# Patient Record
Sex: Female | Born: 1960 | Race: Black or African American | Hispanic: No | Smoking: Never smoker
Health system: Southern US, Community
[De-identification: ages and names within clinical notes are randomized; demographics above are authoritative.]

## PROBLEM LIST (undated history)

## (undated) DIAGNOSIS — Z87898 Personal history of other specified conditions: Secondary | ICD-10-CM

## (undated) DIAGNOSIS — N12 Tubulo-interstitial nephritis, not specified as acute or chronic: Secondary | ICD-10-CM

## (undated) DIAGNOSIS — IMO0002 Reserved for concepts with insufficient information to code with codable children: Secondary | ICD-10-CM

## (undated) DIAGNOSIS — I10 Essential (primary) hypertension: Secondary | ICD-10-CM

## (undated) DIAGNOSIS — N736 Female pelvic peritoneal adhesions (postinfective): Secondary | ICD-10-CM

## (undated) DIAGNOSIS — Z86018 Personal history of other benign neoplasm: Secondary | ICD-10-CM

## (undated) DIAGNOSIS — Z8742 Personal history of other diseases of the female genital tract: Secondary | ICD-10-CM

## (undated) DIAGNOSIS — A599 Trichomoniasis, unspecified: Secondary | ICD-10-CM

## (undated) DIAGNOSIS — N951 Menopausal and female climacteric states: Secondary | ICD-10-CM

## (undated) DIAGNOSIS — N83201 Unspecified ovarian cyst, right side: Secondary | ICD-10-CM

## (undated) HISTORY — DX: Female pelvic peritoneal adhesions (postinfective): N73.6

## (undated) HISTORY — DX: Menopausal and female climacteric states: N95.1

## (undated) HISTORY — DX: Reserved for concepts with insufficient information to code with codable children: IMO0002

## (undated) HISTORY — DX: Trichomoniasis, unspecified: A59.9

## (undated) HISTORY — DX: Personal history of other specified conditions: Z87.898

## (undated) HISTORY — DX: Personal history of other diseases of the female genital tract: Z87.42

## (undated) HISTORY — DX: Tubulo-interstitial nephritis, not specified as acute or chronic: N12

## (undated) HISTORY — DX: Unspecified ovarian cyst, right side: N83.201

## (undated) HISTORY — DX: Personal history of other benign neoplasm: Z86.018

---

## 1988-09-17 DIAGNOSIS — Z86018 Personal history of other benign neoplasm: Secondary | ICD-10-CM

## 1988-09-17 HISTORY — DX: Personal history of other benign neoplasm: Z86.018

## 1991-09-18 DIAGNOSIS — Z8742 Personal history of other diseases of the female genital tract: Secondary | ICD-10-CM

## 1991-09-18 HISTORY — DX: Personal history of other diseases of the female genital tract: Z87.42

## 1992-08-17 HISTORY — PX: ANKLE SURGERY: SHX546

## 1992-10-18 DIAGNOSIS — N12 Tubulo-interstitial nephritis, not specified as acute or chronic: Secondary | ICD-10-CM

## 1992-10-18 HISTORY — DX: Tubulo-interstitial nephritis, not specified as acute or chronic: N12

## 1992-11-09 DIAGNOSIS — IMO0002 Reserved for concepts with insufficient information to code with codable children: Secondary | ICD-10-CM

## 1992-11-09 HISTORY — DX: Reserved for concepts with insufficient information to code with codable children: IMO0002

## 1993-01-15 DIAGNOSIS — N736 Female pelvic peritoneal adhesions (postinfective): Secondary | ICD-10-CM

## 1993-01-15 HISTORY — DX: Female pelvic peritoneal adhesions (postinfective): N73.6

## 1993-01-30 HISTORY — PX: MYOMECTOMY ABDOMINAL APPROACH: SUR870

## 1993-06-19 DIAGNOSIS — A599 Trichomoniasis, unspecified: Secondary | ICD-10-CM

## 1993-06-19 HISTORY — DX: Trichomoniasis, unspecified: A59.9

## 1998-02-18 ENCOUNTER — Emergency Department (HOSPITAL_COMMUNITY): Admission: EM | Admit: 1998-02-18 | Discharge: 1998-02-18 | Payer: Self-pay | Admitting: Emergency Medicine

## 1998-08-31 ENCOUNTER — Emergency Department (HOSPITAL_COMMUNITY): Admission: EM | Admit: 1998-08-31 | Discharge: 1998-08-31 | Payer: Self-pay | Admitting: Emergency Medicine

## 2001-02-13 DIAGNOSIS — N951 Menopausal and female climacteric states: Secondary | ICD-10-CM

## 2001-02-13 HISTORY — DX: Menopausal and female climacteric states: N95.1

## 2003-11-18 ENCOUNTER — Other Ambulatory Visit: Admission: RE | Admit: 2003-11-18 | Discharge: 2003-11-18 | Payer: Self-pay | Admitting: Obstetrics and Gynecology

## 2005-10-02 ENCOUNTER — Other Ambulatory Visit: Admission: RE | Admit: 2005-10-02 | Discharge: 2005-10-02 | Payer: Self-pay | Admitting: Obstetrics and Gynecology

## 2006-06-21 DIAGNOSIS — Z87898 Personal history of other specified conditions: Secondary | ICD-10-CM

## 2006-06-21 DIAGNOSIS — Z8742 Personal history of other diseases of the female genital tract: Secondary | ICD-10-CM

## 2006-06-21 HISTORY — DX: Personal history of other specified conditions: Z87.898

## 2006-06-21 HISTORY — DX: Personal history of other diseases of the female genital tract: Z87.42

## 2006-08-16 DIAGNOSIS — N83201 Unspecified ovarian cyst, right side: Secondary | ICD-10-CM

## 2006-08-16 HISTORY — DX: Unspecified ovarian cyst, right side: N83.201

## 2007-01-10 ENCOUNTER — Encounter: Admission: RE | Admit: 2007-01-10 | Discharge: 2007-01-10 | Payer: Self-pay | Admitting: Obstetrics and Gynecology

## 2010-10-08 ENCOUNTER — Encounter: Payer: Self-pay | Admitting: Obstetrics and Gynecology

## 2010-10-08 ENCOUNTER — Encounter: Payer: Self-pay | Admitting: Cardiology

## 2011-12-04 ENCOUNTER — Ambulatory Visit: Payer: Self-pay | Admitting: Obstetrics and Gynecology

## 2011-12-28 ENCOUNTER — Ambulatory Visit: Payer: Self-pay | Admitting: Obstetrics and Gynecology

## 2012-01-15 ENCOUNTER — Ambulatory Visit (INDEPENDENT_AMBULATORY_CARE_PROVIDER_SITE_OTHER): Payer: Private Health Insurance - Indemnity | Admitting: Obstetrics and Gynecology

## 2012-01-15 ENCOUNTER — Encounter: Payer: Self-pay | Admitting: Obstetrics and Gynecology

## 2012-01-15 VITALS — BP 122/88 | Resp 14 | Ht 65.0 in | Wt 189.0 lb

## 2012-01-15 DIAGNOSIS — Z30431 Encounter for routine checking of intrauterine contraceptive device: Secondary | ICD-10-CM

## 2012-01-15 DIAGNOSIS — Z975 Presence of (intrauterine) contraceptive device: Secondary | ICD-10-CM

## 2012-01-15 DIAGNOSIS — Z124 Encounter for screening for malignant neoplasm of cervix: Secondary | ICD-10-CM

## 2012-01-15 DIAGNOSIS — Z01419 Encounter for gynecological examination (general) (routine) without abnormal findings: Secondary | ICD-10-CM
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
## 2012-01-15 NOTE — Progress Notes (Signed)
Addended by: Marla Roe A on: 01/15/2012 05:01 PM   Modules accepted: Orders

## 2012-01-16 ENCOUNTER — Other Ambulatory Visit: Payer: Self-pay | Admitting: Obstetrics and Gynecology

## 2012-01-16 ENCOUNTER — Telehealth: Payer: Self-pay | Admitting: Obstetrics and Gynecology

## 2012-01-16 ENCOUNTER — Other Ambulatory Visit: Payer: Self-pay

## 2012-01-16 DIAGNOSIS — Z1231 Encounter for screening mammogram for malignant neoplasm of breast: Secondary | ICD-10-CM

## 2012-01-17 LAB — PAP IG, CT-NG, RFX HPV ASCU: Chlamydia Probe Amp: NEGATIVE

## 2012-02-01 ENCOUNTER — Encounter: Payer: Self-pay | Admitting: Obstetrics and Gynecology

## 2012-02-01 ENCOUNTER — Ambulatory Visit (INDEPENDENT_AMBULATORY_CARE_PROVIDER_SITE_OTHER): Payer: Private Health Insurance - Indemnity | Admitting: Obstetrics and Gynecology

## 2012-02-01 ENCOUNTER — Ambulatory Visit: Payer: Self-pay

## 2012-02-01 VITALS — BP 120/72 | Ht 65.0 in | Wt 188.0 lb

## 2012-02-01 DIAGNOSIS — Z975 Presence of (intrauterine) contraceptive device: Secondary | ICD-10-CM

## 2012-02-01 DIAGNOSIS — Z30430 Encounter for insertion of intrauterine contraceptive device: Secondary | ICD-10-CM

## 2012-02-01 MED ORDER — LEVONORGESTREL 20 MCG/24HR IU IUD
INTRAUTERINE_SYSTEM | Freq: Once | INTRAUTERINE | Status: AC
  Administered 2012-02-01: 15:00:00 via INTRAUTERINE

## 2012-02-01 NOTE — Progress Notes (Signed)
No complaints Here for IUD removal and reinsertion  Filed Vitals:   02/01/12 1403  BP: 120/72   IUD INSERTION NOTE   Consent signed after risks and benefits were reviewed including but not limited to bleeding, infection and risk of uterine perforation. LMP: No LMP recorded. Patient is not currently having periods (Reason: IUD). UPT: Negative GC / Chlamydia: negative 01/15/12  MIRENA SERIAL NUMBER: see MAR  Prepping with Betadine Tenaculum placed on anterior lip of cervix Uterus sounded at  8.5 cm Insertion of MIRENA IUD per protocol without any complications  POST-PROCEDURE:  Patient instructed to call with fever or excessive pain Patient instructed to check IUD strings after each menstrual cycle   Follow-up: 5 weeks for IUD check F/U for colpo secondary to abnl pap   Purcell Nails MD 02/01/2012 2:30 PM

## 2012-02-01 NOTE — Progress Notes (Deleted)
Patient ID: Gwendolyn Luna, female   DOB: 1961-09-02, 52 y.o.   MRN: [id number] IUD INSERTION NOTE   Consent signed after risks and benefits were reviewed including but not limited to bleeding, infection and risk of uterine perforation.  LMP: No LMP recorded. Patient is not currently having periods (Reason: IUD). UPT: {pos/neg:18640} GC / Chlamydia: {pos/neg/not done:321853}  MIRENA SERIAL NUMBER: ***  Prepping with Betadine Tenaculum placed on anterior lip of cervix Uterus sounded at  *** cm Insertion of MIRENA IUD per protocol without any complications  POST-PROCEDURE:  Patient instructed to call with fever or excessive pain Patient instructed to check IUD strings after each menstrual cycle   Follow-up: *** weeks   Levin Erp MD 02/01/2012 2:34 PM

## 2012-02-04 ENCOUNTER — Telehealth: Payer: Self-pay

## 2012-02-04 NOTE — Telephone Encounter (Signed)
Spoke to pt . Notified of pap results and scheduled for colpo on 03/19/2012. Pt then called later and I re-explained procedure and answered ?'s., per AF Melody Comas A

## 2012-02-04 NOTE — Telephone Encounter (Signed)
Spoke to pt and scheduled colpo for 03/19/2012 w/ AR. IUD check can be the same day. Melody Comas A

## 2012-02-08 ENCOUNTER — Telehealth: Payer: Self-pay | Admitting: Obstetrics and Gynecology

## 2012-02-08 NOTE — Telephone Encounter (Signed)
Jackie/epic °

## 2012-02-15 ENCOUNTER — Ambulatory Visit
Admission: RE | Admit: 2012-02-15 | Discharge: 2012-02-15 | Disposition: A | Discharge status: Home / Self Care | Payer: Private Health Insurance - Indemnity | Source: Ambulatory Visit | Attending: Obstetrics and Gynecology | Admitting: Obstetrics and Gynecology

## 2012-02-15 DIAGNOSIS — Z1231 Encounter for screening mammogram for malignant neoplasm of breast: Secondary | ICD-10-CM

## 2012-02-20 ENCOUNTER — Telehealth: Payer: Self-pay

## 2012-02-20 NOTE — Telephone Encounter (Signed)
LM for pt to cb to get her r/s for the colpo she was sched for on 03/19/2012 when Dr. Su Hilt is not in the office. Melody Comas A

## 2012-02-26 ENCOUNTER — Telehealth: Payer: Self-pay | Admitting: Obstetrics and Gynecology

## 2012-02-26 NOTE — Telephone Encounter (Signed)
Gwendolyn Luna/return call

## 2012-02-28 ENCOUNTER — Telehealth: Payer: Self-pay

## 2012-03-03 ENCOUNTER — Emergency Department (HOSPITAL_BASED_OUTPATIENT_CLINIC_OR_DEPARTMENT_OTHER): Payer: Private Health Insurance - Indemnity

## 2012-03-03 ENCOUNTER — Emergency Department (HOSPITAL_BASED_OUTPATIENT_CLINIC_OR_DEPARTMENT_OTHER)
Admission: EM | Admit: 2012-03-03 | Discharge: 2012-03-03 | Disposition: A | Discharge status: Home / Self Care | Payer: Private Health Insurance - Indemnity | Attending: Emergency Medicine | Admitting: Emergency Medicine

## 2012-03-03 ENCOUNTER — Encounter (HOSPITAL_BASED_OUTPATIENT_CLINIC_OR_DEPARTMENT_OTHER): Payer: Self-pay

## 2012-03-03 DIAGNOSIS — M25569 Pain in unspecified knee: Secondary | ICD-10-CM | POA: Insufficient documentation

## 2012-03-03 DIAGNOSIS — Z79899 Other long term (current) drug therapy: Secondary | ICD-10-CM | POA: Insufficient documentation

## 2012-03-03 DIAGNOSIS — M25561 Pain in right knee: Secondary | ICD-10-CM

## 2012-03-03 MED ORDER — HYDROCODONE-ACETAMINOPHEN 5-325 MG PO TABS: 2.0000 | ORAL_TABLET | ORAL | Status: AC | PRN

## 2012-03-03 MED ORDER — IBUPROFEN 800 MG PO TABS: 800.0000 mg | ORAL_TABLET | Freq: Three times a day (TID) | ORAL | Status: AC

## 2012-03-03 NOTE — ED Provider Notes (Signed)
Medical screening examination/treatment/procedure(s) were performed by non-physician practitioner and as supervising physician I was immediately available for consultation/collaboration.   Charissa Knowles, MD 03/03/12 2343 

## 2012-03-03 NOTE — Discharge Instructions (Signed)
Knee Pain The knee is the complex joint between your thigh and your lower leg. It is made up of bones, tendons, ligaments, and cartilage. The bones that make up the knee are:  The femur in the thigh.   The tibia and fibula in the lower leg.   The patella or kneecap riding in the groove on the lower femur.  CAUSES  Knee pain is a common complaint with many causes. A few of these causes are:  Injury, such as:   A ruptured ligament or tendon injury.   Torn cartilage.   Medical conditions, such as:   Gout   Arthritis   Infections   Overuse, over training or overdoing a physical activity.  Knee pain can be minor or severe. Knee pain can accompany debilitating injury. Minor knee problems often respond well to self-care measures or get well on their own. More serious injuries may need medical intervention or even surgery. SYMPTOMS The knee is complex. Symptoms of knee problems can vary widely. Some of the problems are:  Pain with movement and weight bearing.   Swelling and tenderness.   Buckling of the knee.   Inability to straighten or extend your knee.   Your knee locks and you cannot straighten it.   Warmth and redness with pain and fever.   Deformity or dislocation of the kneecap.  DIAGNOSIS  Determining what is wrong may be very straight forward such as when there is an injury. It can also be challenging because of the complexity of the knee. Tests to make a diagnosis may include:  Your caregiver taking a history and doing a physical exam.   Routine X-rays can be used to rule out other problems. X-rays will not reveal a cartilage tear. Some injuries of the knee can be diagnosed by:   Arthroscopy a surgical technique by which a small video camera is inserted through tiny incisions on the sides of the knee. This procedure is used to examine and repair internal knee joint problems. Tiny instruments can be used during arthroscopy to repair the torn knee cartilage  (meniscus).   Arthrography is a radiology technique. A contrast liquid is directly injected into the knee joint. Internal structures of the knee joint then become visible on X-ray film.   An MRI scan is a non x-ray radiology procedure in which magnetic fields and a computer produce two- or three-dimensional images of the inside of the knee. Cartilage tears are often visible using an MRI scanner. MRI scans have largely replaced arthrography in diagnosing cartilage tears of the knee.   Blood work.   Examination of the fluid that helps to lubricate the knee joint (synovial fluid). This is done by taking a sample out using a needle and a syringe.  TREATMENT The treatment of knee problems depends on the cause. Some of these treatments are:  Depending on the injury, proper casting, splinting, surgery or physical therapy care will be needed.   Give yourself adequate recovery time. Do not overuse your joints. If you begin to get sore during workout routines, back off. Slow down or do fewer repetitions.   For repetitive activities such as cycling or running, maintain your strength and nutrition.   Alternate muscle groups. For example if you are a weight lifter, work the upper body on one day and the lower body the next.   Either tight or weak muscles do not give the proper support for your knee. Tight or weak muscles do not absorb the stress placed   on the knee joint. Keep the muscles surrounding the knee strong.   Take care of mechanical problems.   If you have flat feet, orthotics or special shoes may help. See your caregiver if you need help.   Arch supports, sometimes with wedges on the inner or outer aspect of the heel, can help. These can shift pressure away from the side of the knee most bothered by osteoarthritis.   A brace called an "unloader" brace also may be used to help ease the pressure on the most arthritic side of the knee.   If your caregiver has prescribed crutches, braces,  wraps or ice, use as directed. The acronym for this is PRICE. This means protection, rest, ice, compression and elevation.   Nonsteroidal anti-inflammatory drugs (NSAID's), can help relieve pain. But if taken immediately after an injury, they may actually increase swelling. Take NSAID's with food in your stomach. Stop them if you develop stomach problems. Do not take these if you have a history of ulcers, stomach pain or bleeding from the bowel. Do not take without your caregiver's approval if you have problems with fluid retention, heart failure, or kidney problems.   For ongoing knee problems, physical therapy may be helpful.   Glucosamine and chondroitin are over-the-counter dietary supplements. Both may help relieve the pain of osteoarthritis in the knee. These medicines are different from the usual anti-inflammatory drugs. Glucosamine may decrease the rate of cartilage destruction.   Injections of a corticosteroid drug into your knee joint may help reduce the symptoms of an arthritis flare-up. They may provide pain relief that lasts a few months. You may have to wait a few months between injections. The injections do have a small increased risk of infection, water retention and elevated blood sugar levels.   Hyaluronic acid injected into damaged joints may ease pain and provide lubrication. These injections may work by reducing inflammation. A series of shots may give relief for as long as 6 months.   Topical painkillers. Applying certain ointments to your skin may help relieve the pain and stiffness of osteoarthritis. Ask your pharmacist for suggestions. Many over the-counter products are approved for temporary relief of arthritis pain.   In some countries, doctors often prescribe topical NSAID's for relief of chronic conditions such as arthritis and tendinitis. A review of treatment with NSAID creams found that they worked as well as oral medications but without the serious side effects.    PREVENTION  Maintain a healthy weight. Extra pounds put more strain on your joints.   Get strong, stay limber. Weak muscles are a common cause of knee injuries. Stretching is important. Include flexibility exercises in your workouts.   Be smart about exercise. If you have osteoarthritis, chronic knee pain or recurring injuries, you may need to change the way you exercise. This does not mean you have to stop being active. If your knees ache after jogging or playing basketball, consider switching to swimming, water aerobics or other low-impact activities, at least for a few days a week. Sometimes limiting high-impact activities will provide relief.   Make sure your shoes fit well. Choose footwear that is right for your sport.   Protect your knees. Use the proper gear for knee-sensitive activities. Use kneepads when playing volleyball or laying carpet. Buckle your seat belt every time you drive. Most shattered kneecaps occur in car accidents.   Rest when you are tired.  SEEK MEDICAL CARE IF:  You have knee pain that is continual and does not   seem to be getting better.  SEEK IMMEDIATE MEDICAL CARE IF:  Your knee joint feels hot to the touch and you have a high fever. MAKE SURE YOU:   Understand these instructions.   Will watch your condition.   Will get help right away if you are not doing well or get worse.  Document Released: 07/01/2007 Document Revised: 08/23/2011 Document Reviewed: 07/01/2007 [facility] Patient Information 2012 [place], [place]. Primarily Go to www.goodrx.com to look up your medications. This will give you a list of where you can find your prescriptions at the most affordable prices.   Call [facility]  [phone]  If you have no primary doctor, here are some resources that may be helpful:  Medicaid-accepting [facility] Providers:   - [facility]- [address]      [phone]      Mon-Fri 9am-7pm, Sat 9am-1pm   - [facility]- [address] [place], [place]      [phone]   - [facility]- [address] [place]      [phone]   [facility]- [address]      [phone]   - Renaye Rakers- [address] [place], [address]      [phone]      Only accepts [place] patients       after they have her name applied to their card   Self Pay (no insurance) in [place]:   - Sickle Cell Patients: Dr Willey Blade, [facility]      [address] [place]      [phone]   - [facility] Connect954 [phone]   - Physician Referral Service- [phone]   - [facility]- [address] [place]      [phone]   [facility]- [address]   - [facility]- see information above      (Speak to [employer] if you do not have insurance)   - [facility]- [address] [place]      [phone]   - [facility] [place]- [address]      [phone]   - [facility]- [address]      [phone]   - Dr Julio Sicks-  [address], [place]      [phone]   - [facility]- [address]      [phone]   - [facility]- [address]      [phone]      Also [address]      [phone]   - [facility]- [address]      [phone]      1st and 3rd Saturday every month, 10am-1pm    Other agencies that provide inexpensive medical care:     [facility]  [phone]    [facility]  [phone]    [facility]  [phone]    [facility]  [phone] [address] [place] [address]    [facility]  [phone]    [facility]  [phone] [facility] [phone]   [address] [place], [place] [address]  Chronic Pain Problems Contact [facility]  [phone] Patients need to  be referred by  their primary care doctor.  [facility]  [facility]     [employer]                          [facility] [address] [place]                       [address]      [address] [place] [address]   [phone] (After Hours)  General Information: Finding a doctor when you do not have health insurance can be tricky. Although you are not limited by an insurance plan, you are of course limited by her finances and how much but he can pay out of pocket.  What are your options if you don't have health insurance?   1) Find a Librarian, academic and Pay Out of Pocket Although you won't have to find out who is covered by your insurance plan, it is a good idea to ask around and get recommendations. You will then need to call the office and see if the doctor you have chosen will accept you as a new patient and what types of options they offer for patients who are self-pay. Some doctors offer discounts or will set up payment plans for their patients who do not have insurance, but you will need to ask so you aren't surprised when you get to your appointment.  2) Contact Your Local Health Department Not all health departments have doctors that can see patients for sick visits, but many do, so it is worth a call to see if yours does. If you don't know where your local health department is, you can check in your phone book. The CDC also has a tool to help you locate your state's health department, and many state websites also have listings of all of their local health departments.  3) Find a Walk-in Clinic If your illness is not likely to be very severe or complicated, you may want to try a walk in clinic. These are popping up all over the country in pharmacies, drugstores, and shopping centers. They're usually staffed by nurse practitioners or physician assistants that have been trained to treat common illnesses and complaints. They're usually fairly quick and  inexpensive. However, if you have serious medical issues or chronic medical problems, these are probably not your best option

## 2012-03-03 NOTE — ED Notes (Signed)
Pt reports right knee pain that started last night.

## 2012-03-03 NOTE — ED Provider Notes (Signed)
History     CSN: [id number]  Arrival date & time 03/03/12  4098   First MD Initiated Contact with Patient 03/03/12 1903      Chief Complaint  Patient presents with  . Knee Pain    (Consider location/radiation/quality/duration/timing/severity/associated sxs/prior treatment) Patient is a 51 y.o. female presenting with knee pain. The history is provided by the patient. No language interpreter was used.  Knee Pain This is a new problem. The current episode started yesterday. The problem occurs constantly. The problem has been unchanged. Associated symptoms include joint swelling and myalgias. Nothing aggravates the symptoms. She has tried nothing for the symptoms. The treatment provided moderate relief.  Pt complains of pain to her right knee.  Pt reports no injury.   Pt reports she stood from a sitting position and had pain in her right knee  Past Medical History  Diagnosis Date  . H/O menorrhagia 06/21/2006  . Perimenopausal symptoms 02/13/01  . H/O urinary incontinence 06/21/06  . H/O dyspareunia 11/09/92  . History of uterine fibroid 1990  . Ovarian cyst, right 08/16/06  . Pyelonephritis 2/94  . H/O infertility 76  . Trichomonas 06/19/93  . Pelvic adhesions 5/94    Past Surgical History  Procedure Date  . Myomectomy abdominal approach 01/30/93  . Ankle surgery 12/93    No family history on file.  History  Substance Use Topics  . Smoking status: Never Smoker   . Smokeless tobacco: Never Used  . Alcohol Use: No    OB History    Grav Para Term Preterm Abortions TAB SAB Ect Mult Living   1    1           Review of Systems  Musculoskeletal: Positive for myalgias and joint swelling.  All other systems reviewed and are negative.    Allergies  Penicillins  Home Medications   Current Outpatient Rx  Name Route Sig Dispense Refill  . FERROUS SULFATE 325 (65 FE) MG PO TABS Oral Take 325 mg by mouth daily with breakfast.    . LEVONORGESTREL 20 MCG/24HR IU IUD  Intrauterine 1 each by Intrauterine route once.    Marland Kitchen PHENTERMINE HCL 37.5 MG PO TABS Oral Take 37.5 mg by mouth daily before breakfast.      BP 127/83  Pulse 78  Temp 98.5 F (36.9 C) (Oral)  Resp 16  Ht 5\' 4"  (1.626 m)  Wt 180 lb (81.647 kg)  BMI 30.90 kg/m2  SpO2 100%  Physical Exam  Nursing note and vitals reviewed. Constitutional: She appears well-developed and well-nourished.  HENT:  Head: Normocephalic and atraumatic.  Musculoskeletal: She exhibits edema and tenderness.       No medial or lateral instability,  Tender around medial joint line  Neurological: She is alert.  Skin: Skin is warm.  Psychiatric: She has a normal mood and affect.    ED Course  Procedures (including critical care time)  Labs Reviewed - No data to display Dg Knee Complete 4 Views Right  03/03/2012  *RADIOLOGY REPORT*  Clinical Data: Anterior right knee pain.  RIGHT KNEE - COMPLETE 4+ VIEW  Comparison:  None.  Findings:  There is no evidence of fracture, dislocation, or joint effusion.  There is no evidence of arthropathy or other focal bone abnormality.  Soft tissues are unremarkable.  IMPRESSION: Negative.  Original Report Authenticated By: Danae Orleans, M.D.     No diagnosis found.    MDM  Pt placed in a knee immbolizer,   I  advised follow up with Dr. Pearletha Forge for recheck.   Ice and pain medications        Lonia Skinner [place], [place] 03/03/12 1929

## 2012-03-04 ENCOUNTER — Telehealth: Payer: Self-pay

## 2012-03-04 NOTE — Telephone Encounter (Signed)
Left message for pt to return call regarding rescheduling Colpo. Gwendolyn Luna

## 2012-03-04 NOTE — Telephone Encounter (Signed)
Pt returned call and was advised that Colpo had to be RS from 03/19/2012 due to Dr. Su Hilt not being in the office that day. Pt was offered 2 dates 7/12 and 7/18. Pt requested 7/18 @ 1:30. Pt was reminded of all instructions. Gwendolyn Luna

## 2012-03-19 ENCOUNTER — Encounter: Payer: Private Health Insurance - Indemnity | Admitting: Obstetrics and Gynecology

## 2012-03-31 ENCOUNTER — Encounter: Payer: Private Health Insurance - Indemnity | Admitting: Obstetrics and Gynecology

## 2012-04-03 ENCOUNTER — Encounter: Payer: Private Health Insurance - Indemnity | Admitting: Obstetrics and Gynecology

## 2012-04-15 ENCOUNTER — Telehealth: Payer: Self-pay

## 2012-04-15 NOTE — Telephone Encounter (Signed)
Encounter created to close encounter. JO, CMA

## 2012-07-21 NOTE — Telephone Encounter (Signed)
Note to close encounter only. Da Michelle, Jacqueline A  

## 2012-10-03 IMAGING — CR DG KNEE COMPLETE 4+V*R*
4 series · 4 of 4 positions shown · non-contrast
Comparison: None.

CLINICAL DATA: Anterior right knee pain.

RIGHT KNEE - COMPLETE 4+ VIEW

[t knee ap right]
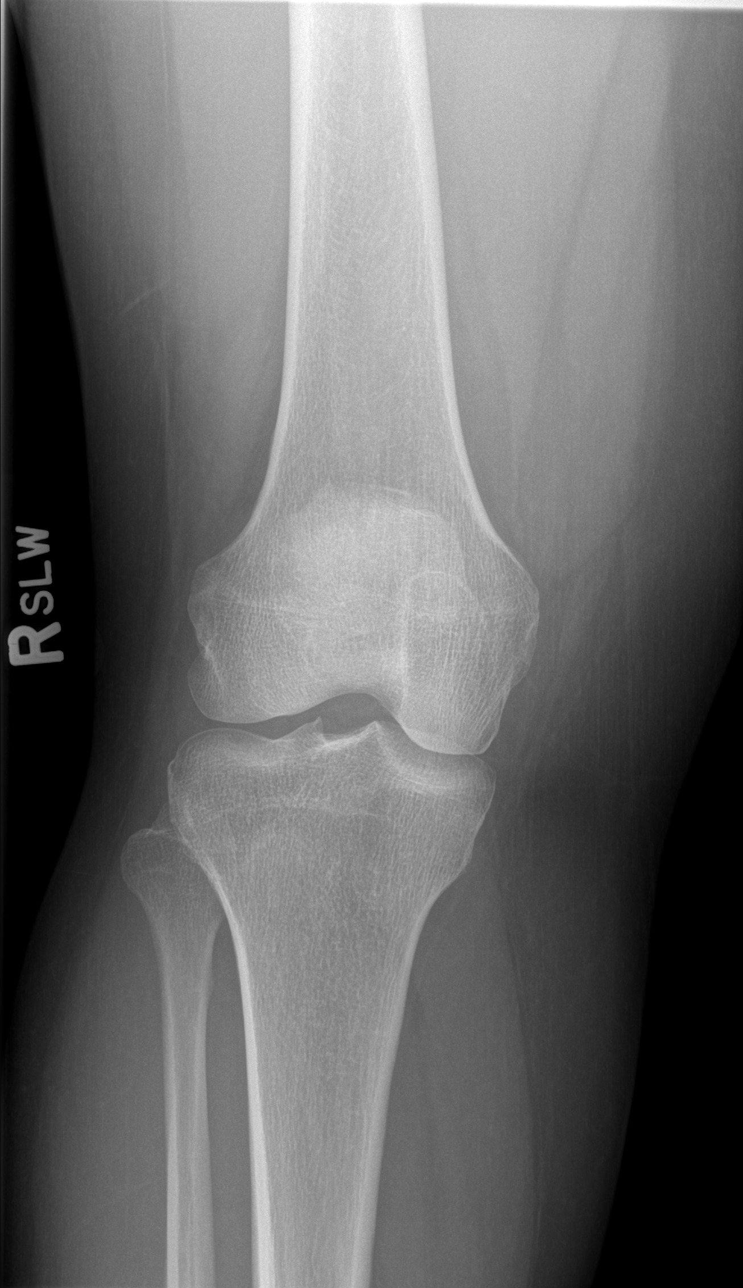

[t knee oblique right (1 of 2)]
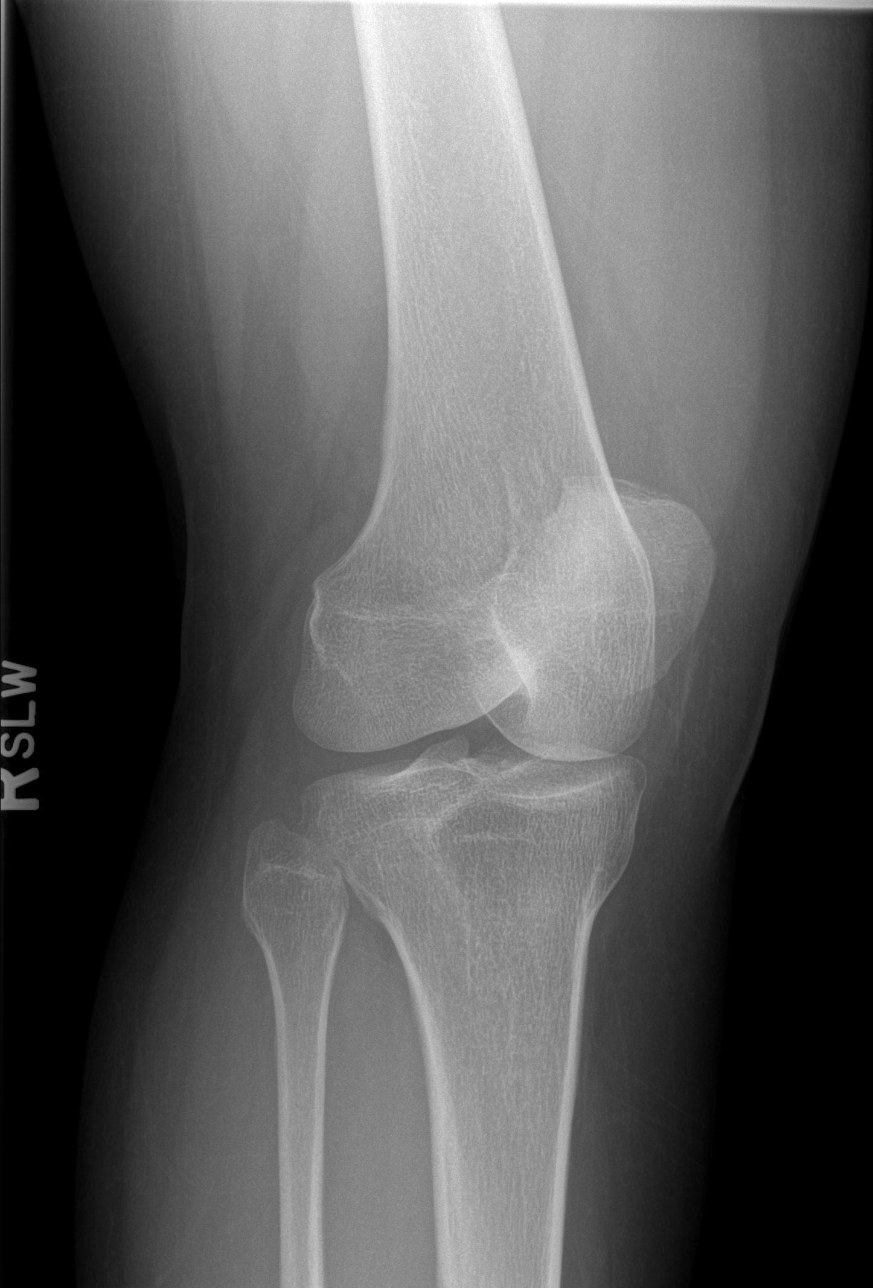

[t knee oblique right (2 of 2)]
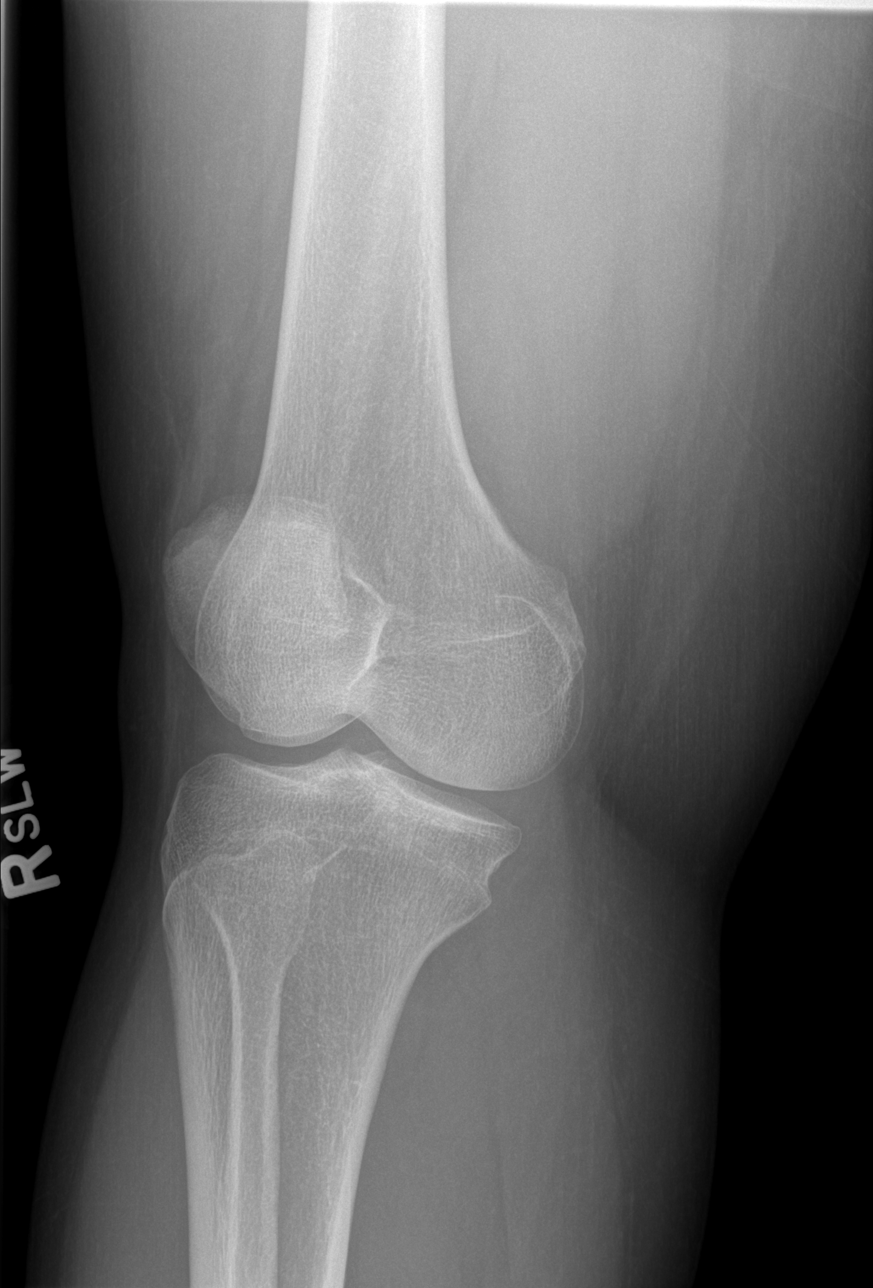

[t knee lat right]
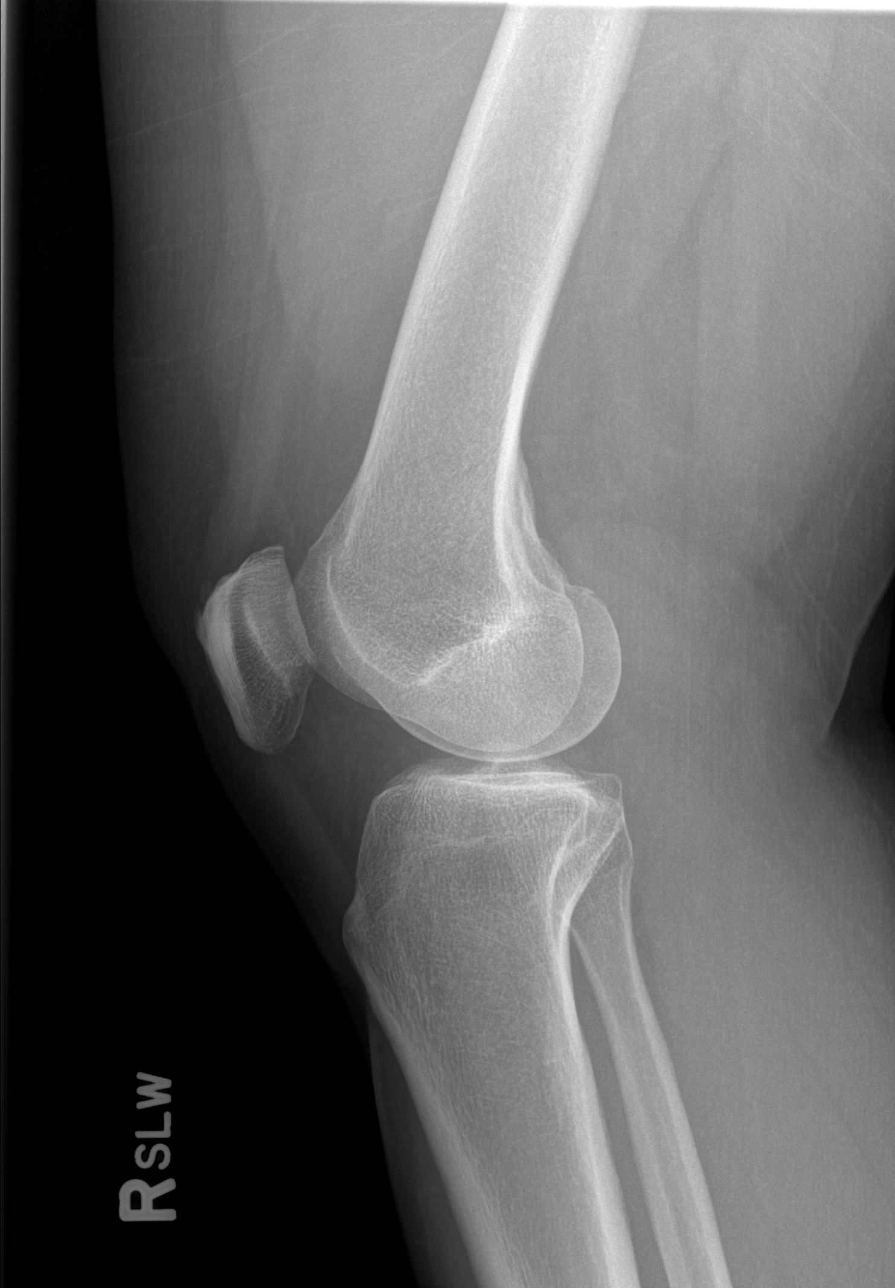

[4 of 4 positions shown; findings below may reference images not displayed]

FINDINGS: There is no evidence of fracture, dislocation, or joint
effusion.  There is no evidence of arthropathy or other focal bone
abnormality.  Soft tissues are unremarkable.
IMPRESSION: Negative.

## 2013-06-13 ENCOUNTER — Emergency Department (HOSPITAL_BASED_OUTPATIENT_CLINIC_OR_DEPARTMENT_OTHER)
Admission: EM | Admit: 2013-06-13 | Discharge: 2013-06-14 | Disposition: A | Discharge status: Home / Self Care | Payer: Managed Care, Other (non HMO) | Attending: Emergency Medicine | Admitting: Emergency Medicine

## 2013-06-13 ENCOUNTER — Encounter (HOSPITAL_BASED_OUTPATIENT_CLINIC_OR_DEPARTMENT_OTHER): Payer: Self-pay | Admitting: Emergency Medicine

## 2013-06-13 DIAGNOSIS — H10219 Acute toxic conjunctivitis, unspecified eye: Secondary | ICD-10-CM | POA: Insufficient documentation

## 2013-06-13 DIAGNOSIS — Z87448 Personal history of other diseases of urinary system: Secondary | ICD-10-CM | POA: Insufficient documentation

## 2013-06-13 DIAGNOSIS — Z8742 Personal history of other diseases of the female genital tract: Secondary | ICD-10-CM | POA: Insufficient documentation

## 2013-06-13 DIAGNOSIS — H10211 Acute toxic conjunctivitis, right eye: Secondary | ICD-10-CM

## 2013-06-13 DIAGNOSIS — Z8619 Personal history of other infectious and parasitic diseases: Secondary | ICD-10-CM | POA: Insufficient documentation

## 2013-06-13 NOTE — ED Notes (Signed)
Pt reports sudden onset of eye pain and swelling denies event or injury

## 2013-06-14 MED ORDER — ERYTHROMYCIN 5 MG/GM OP OINT
TOPICAL_OINTMENT | Freq: Four times a day (QID) | OPHTHALMIC | Status: DC
  Administered 2013-06-14: 02:00:00 via OPHTHALMIC
  Filled 2013-06-14: qty 3.5

## 2013-06-14 MED ORDER — HYDROCODONE-ACETAMINOPHEN 5-325 MG PO TABS: 1.0000 | ORAL_TABLET | Freq: Four times a day (QID) | ORAL | Status: AC | PRN

## 2013-06-14 MED ORDER — FLUORESCEIN SODIUM 1 MG OP STRP
ORAL_STRIP | OPHTHALMIC | Status: AC
  Administered 2013-06-14: 1
  Filled 2013-06-14: qty 1

## 2013-06-14 MED ORDER — TETRACAINE HCL 0.5 % OP SOLN
OPHTHALMIC | Status: AC
  Administered 2013-06-14: 1 [drp]
  Filled 2013-06-14: qty 2

## 2013-06-14 MED ORDER — IBUPROFEN 800 MG PO TABS
800.0000 mg | ORAL_TABLET | Freq: Once | ORAL | Status: AC
  Administered 2013-06-14: 800 mg via ORAL
  Filled 2013-06-14: qty 1

## 2013-06-14 NOTE — ED Provider Notes (Signed)
CSN: [id number]     Arrival date & time 06/13/13  2131 History  This chart was scribed for Hanley Seamen, MD by Greggory Stallion, ED Scribe. This patient was seen in room MH05/MH05 and the patient's care was started at 12:53 AM.   Chief Complaint  Patient presents with  . Eye Pain   The history is provided by the patient. No language interpreter was used.    HPI Comments: Gwendolyn Luna is a 52 y.o. female who presents to the Emergency Department complaining of sudden onset, constant right eye pain with associated photophobia that started earlier today. She states she had her eyelashes done today and thinks it is the source of the pain. Pt has taken her eyelashes off. She denies any other associated symptoms.   Past Medical History  Diagnosis Date  . H/O menorrhagia 06/21/2006  . Perimenopausal symptoms 02/13/01  . H/O urinary incontinence 06/21/06  . H/O dyspareunia 11/09/92  . History of uterine fibroid 1990  . Ovarian cyst, right 08/16/06  . Pyelonephritis 2/94  . H/O infertility 37  . Trichomonas 06/19/93  . Pelvic adhesions 5/94   Past Surgical History  Procedure Laterality Date  . Myomectomy abdominal approach  01/30/93  . Ankle surgery  12/93   History reviewed. No pertinent family history. History  Substance Use Topics  . Smoking status: Never Smoker   . Smokeless tobacco: Never Used  . Alcohol Use: No   OB History   Grav Para Term Preterm Abortions TAB SAB Ect Mult Living   1    1          Review of Systems A complete 10 system review of systems was obtained and all systems are negative except as noted in the HPI and PMH.   Allergies  Penicillins  Home Medications   Current Outpatient Rx  Name  Route  Sig  Dispense  Refill  . ferrous sulfate 325 (65 FE) MG tablet   Oral   Take 325 mg by mouth daily with breakfast.         . HYDROcodone-acetaminophen (NORCO/VICODIN) 5-325 MG per tablet   Oral   Take 1-2 tablets by mouth every 6 (six) hours as needed for  pain.   20 tablet   0   . levonorgestrel (MIRENA) 20 MCG/24HR IUD   Intrauterine   1 each by Intrauterine route once.         . phentermine (ADIPEX-P) 37.5 MG tablet   Oral   Take 37.5 mg by mouth daily before breakfast.          BP 153/104  Pulse 90  Temp(Src) 98.5 F (36.9 C) (Oral)  Resp 20  Ht 5\' 4"  (1.626 m)  Wt 180 lb (81.647 kg)  BMI 30.88 kg/m2  SpO2 100%  Physical Exam  Nursing note and vitals reviewed.  General: Well-developed, well-nourished female in no acute distress; appearance consistent with age of record HENT: normocephalic; atraumatic Eyes: pupils equal, round and reactive to light; extraocular muscles intact; arcus senilis bilaterally; edema of right upper and lower eyelids with conjunctival injection and serous discharge on the right. A small area of fluorescein uptake on right cornea at 3 o'clock; with lids closed, eyes are equally soft when palpated with no tenderness. Right eye pH 8 Neck: supple Heart: regular rate and rhythm; no murmurs, rubs or gallops Lungs: clear to auscultation bilaterally Abdomen: soft; nondistended; nontender; no masses or hepatosplenomegaly; bowel sounds present Extremities: No deformity; full range of motion; pulses  normal Neurologic: Awake, alert and oriented; motor function intact in all extremities and symmetric; no facial droop Skin: Warm and dry Psychiatric: Normal mood and affect  ED Course  Procedures (including critical care time)  DIAGNOSTIC STUDIES: Oxygen Saturation is 100% on RA, normal by my interpretation.    COORDINATION OF CARE: 1:00 AM-Discussed treatment plan which includes checking pH of eye and checking for corneal abrasion with pt at bedside and pt agreed to plan.   1:03 AM-Will discharge pt with antibiotic ointment and pain medication.   MDM   1. Chemical conjunctivitis of right eye    I personally performed the services described in this documentation, which was scribed in my presence.   The recorded information has been reviewed and is accurate.   Hanley Seamen, MD 06/14/13 [phone]

## 2014-07-19 ENCOUNTER — Encounter (HOSPITAL_BASED_OUTPATIENT_CLINIC_OR_DEPARTMENT_OTHER): Payer: Self-pay | Admitting: Emergency Medicine

## 2019-04-06 ENCOUNTER — Emergency Department (HOSPITAL_BASED_OUTPATIENT_CLINIC_OR_DEPARTMENT_OTHER): Payer: 59

## 2019-04-06 ENCOUNTER — Emergency Department (HOSPITAL_BASED_OUTPATIENT_CLINIC_OR_DEPARTMENT_OTHER)
Admission: EM | Admit: 2019-04-06 | Discharge: 2019-04-07 | Disposition: A | Discharge status: Home / Self Care | Payer: 59 | Attending: Emergency Medicine | Admitting: Emergency Medicine

## 2019-04-06 ENCOUNTER — Other Ambulatory Visit: Payer: Self-pay

## 2019-04-06 ENCOUNTER — Encounter (HOSPITAL_BASED_OUTPATIENT_CLINIC_OR_DEPARTMENT_OTHER): Payer: Self-pay

## 2019-04-06 DIAGNOSIS — S0081XA Abrasion of other part of head, initial encounter: Secondary | ICD-10-CM | POA: Insufficient documentation

## 2019-04-06 DIAGNOSIS — W010XXA Fall on same level from slipping, tripping and stumbling without subsequent striking against object, initial encounter: Secondary | ICD-10-CM

## 2019-04-06 DIAGNOSIS — W01198A Fall on same level from slipping, tripping and stumbling with subsequent striking against other object, initial encounter: Secondary | ICD-10-CM | POA: Diagnosis not present

## 2019-04-06 DIAGNOSIS — Y9301 Activity, walking, marching and hiking: Secondary | ICD-10-CM | POA: Diagnosis not present

## 2019-04-06 DIAGNOSIS — Y92480 Sidewalk as the place of occurrence of the external cause: Secondary | ICD-10-CM | POA: Diagnosis not present

## 2019-04-06 DIAGNOSIS — S60221A Contusion of right hand, initial encounter: Secondary | ICD-10-CM | POA: Diagnosis not present

## 2019-04-06 DIAGNOSIS — Y998 Other external cause status: Secondary | ICD-10-CM | POA: Diagnosis not present

## 2019-04-06 DIAGNOSIS — S0990XA Unspecified injury of head, initial encounter: Secondary | ICD-10-CM | POA: Diagnosis present

## 2019-04-06 NOTE — ED Triage Notes (Signed)
Pt states she was walking in neighborhood-tripped/fell ~45 min PTA-pain to right hand-abrasion to right cheek-skin tear to right 5th finger-no LOC-NAD-steady gait

## 2019-04-07 MED ORDER — NAPROXEN 250 MG PO TABS
500.0000 mg | ORAL_TABLET | Freq: Once | ORAL | Status: AC
  Administered 2019-04-07: 500 mg via ORAL
  Filled 2019-04-07: qty 2

## 2019-04-07 NOTE — Discharge Instructions (Signed)
Apply ice for thirty minutes at a time, four times a day.  Apply antibiotic ointment (e.g. bacitracin, Neosporin, triple antibiotic)  twice a day.  Take naproxen - two tablets at a time, twice a day.  Take acetaminophen as needed for additional pain relief.

## 2019-04-07 NOTE — ED Provider Notes (Signed)
[facility] EMERGENCY DEPARTMENT Provider Note   CSN: [id number] Arrival date & time: 04/06/19  2118    History   Chief Complaint Chief Complaint  Patient presents with  . Fall    HPI Gwendolyn Luna is a 58 y.o. female.   The history is provided by the patient.  She states that she tripped over some uneven area on the sidewalk and fell suffering an abrasion to her right cheek and pain to her right hand.  She denies loss of consciousness but states that she was momentarily stunned.  There has been no incoordination and no nausea or vomiting.  She is up-to-date on tetanus immunizations.  Past Medical History:  Diagnosis Date  . H/O dyspareunia 11/09/92  . H/O infertility [id number]  . H/O menorrhagia 06/21/2006  . H/O urinary incontinence 06/21/06  . History of uterine fibroid 1990  . Ovarian cyst, right 08/16/06  . Pelvic adhesions 5/94  . Perimenopausal symptoms 02/13/01  . Pyelonephritis 2/94  . Trichomonas 06/19/93    Patient Active Problem List   Diagnosis Date Noted  . IUD contraception 01/15/2012    Past Surgical History:  Procedure Laterality Date  . ANKLE SURGERY  12/93  . MYOMECTOMY ABDOMINAL APPROACH  01/30/93     OB History    Gravida  1   Para      Term      Preterm      AB  1   Living        SAB      TAB      Ectopic      Multiple      Live Births               Home Medications    Prior to Admission medications   Medication Sig Start Date End Date Taking? Authorizing Provider  ferrous sulfate 325 (65 FE) MG tablet Take 325 mg by mouth daily with breakfast.    [provider]  HYDROcodone-acetaminophen (NORCO/VICODIN) 5-325 MG per tablet Take 1-2 tablets by mouth every 6 (six) hours as needed for pain. 06/14/13   Molpus, John, MD  levonorgestrel (MIRENA) 20 MCG/24HR IUD 1 each by Intrauterine route once.    [provider]  phentermine (ADIPEX-P) 37.5 MG tablet Take 37.5 mg by mouth daily before breakfast.     [provider]    Family History No family history on file.  Social History Social History   Tobacco Use  . Smoking status: Never Smoker  . Smokeless tobacco: Never Used  Substance Use Topics  . Alcohol use: No  . Drug use: No     Allergies   Penicillins   Review of Systems Review of Systems  All other systems reviewed and are negative.    Physical Exam Updated Vital Signs BP (!) 143/92 (BP Location: Left Arm)   Pulse 88   Temp 98.3 F (36.8 C) (Oral)   Resp 20   Ht 5\' 4"  (1.626 m)   Wt 99.8 kg   SpO2 97%   BMI 37.76 kg/m   Physical Exam Vitals signs and nursing note reviewed.    58 year old female, resting comfortably and in no acute distress. Vital signs are significant for mildly elevated blood pressure. Oxygen saturation is 97%, which is normal. Head is normocephalic.  Minor abrasion noted in the right infraorbital area. PERRLA, EOMI. Oropharynx is clear. Neck is nontender and supple without adenopathy or JVD. Back is nontender and there is no  CVA tenderness. Lungs are clear without rales, wheezes, or rhonchi. Chest is nontender. Heart has regular rate and rhythm without murmur. Abdomen is soft, flat, nontender without masses or hepatosplenomegaly and peristalsis is normoactive. Extremities: Mild swelling of the MCP joints of the right hand.  Skin tear of the right little finger at the PIP joint, volar side.  No other extremity injury seen. Skin is warm and dry without rash. Neurologic: Mental status is normal, cranial nerves are intact, there are no motor or sensory deficits.  ED Treatments / Results   Radiology Dg Hand Complete Right  Result Date: 04/06/2019 CLINICAL DATA:  [place] with laceration of the anterior fifth finger. PIP joint region. EXAM: RIGHT HAND - COMPLETE 3+ VIEW COMPARISON:  None. FINDINGS: There is no evidence of fracture or dislocation. 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 IMPRESSION: No acute or  traumatic finding, with specific attention to the small finger. 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 Electronically Signed   By: Nelson Chimes M.D.   On: 04/06/2019 21:58    Procedures Procedures  Medications Ordered in ED Medications - No data to display   Initial Impression / Assessment and Plan / ED Course  I have reviewed the triage vital signs and the nursing notes.  Pertinent imaging results that were available during my care of the patient were reviewed by me and considered in my medical decision making (see chart for details).  Fall with facial abrasion and contusion of right hand.  X-rays of the right hand are obtained showing no evidence of fracture.  Patient is advised to apply ice and keep hand elevated, use over-the-counter antibiotics as needed for the abrasion.  Recommended over-the-counter NSAIDs as needed for pain, supplement with acetaminophen as needed.  Patient states that her hands are very stiff and she works as a Sport and exercise psychologist, she is given a work release for 2 days.  Old records are reviewed, and she has no relevant past visits.  Final Clinical Impressions(s) / ED Diagnoses   Final diagnoses:  Fall from slip, trip, or stumble, initial encounter  Abrasion of face, initial encounter  Contusion of right hand, initial encounter    ED Discharge Orders    None       Delora Fuel, MD 71/06/26 [phone]

## 2019-04-07 NOTE — ED Notes (Signed)
Patient is A&Ox4 at this time.  Patient in no signs of distress.  Please see providers note for complete history and physical exam.  

## 2021-04-29 ENCOUNTER — Emergency Department (HOSPITAL_BASED_OUTPATIENT_CLINIC_OR_DEPARTMENT_OTHER)
Admission: EM | Admit: 2021-04-29 | Discharge: 2021-04-29 | Disposition: A | Discharge status: Home / Self Care | Payer: BC Managed Care – PPO | Attending: Emergency Medicine | Admitting: Emergency Medicine

## 2021-04-29 ENCOUNTER — Encounter (HOSPITAL_BASED_OUTPATIENT_CLINIC_OR_DEPARTMENT_OTHER): Payer: Self-pay

## 2021-04-29 ENCOUNTER — Other Ambulatory Visit: Payer: Self-pay

## 2021-04-29 DIAGNOSIS — I10 Essential (primary) hypertension: Secondary | ICD-10-CM | POA: Insufficient documentation

## 2021-04-29 DIAGNOSIS — U071 COVID-19: Secondary | ICD-10-CM | POA: Diagnosis not present

## 2021-04-29 DIAGNOSIS — Z79899 Other long term (current) drug therapy: Secondary | ICD-10-CM | POA: Insufficient documentation

## 2021-04-29 DIAGNOSIS — M791 Myalgia, unspecified site: Secondary | ICD-10-CM | POA: Diagnosis present

## 2021-04-29 HISTORY — DX: Essential (primary) hypertension: I10

## 2021-04-29 LAB — BASIC METABOLIC PANEL
Anion gap: 9 (ref 5–15)
BUN: 8 mg/dL (ref 8–23)
CO2: 25 mmol/L (ref 22–32)
Calcium: 9.2 mg/dL (ref 8.9–10.3)
Chloride: 102 mmol/L (ref 98–111)
Creatinine, Ser: 0.87 mg/dL (ref 0.44–1.00)
GFR, Estimated: >60 mL/min (ref >60)
Glucose, Bld: 123 mg/dL — ABNORMAL HIGH (ref 70–99)
Potassium: 3.7 mmol/L (ref 3.5–5.1)
Sodium: 136 mmol/L (ref 135–145)

## 2021-04-29 LAB — CBC WITH DIFFERENTIAL/PLATELET
Abs Immature Granulocytes: 0.01 10*3/uL (ref 0.00–0.07)
Basophils Absolute: 0 10*3/uL (ref 0.0–0.1)
Basophils Relative: 0 %
Eosinophils Absolute: 0 10*3/uL (ref 0.0–0.5)
Eosinophils Relative: 0 %
HCT: 40.8 % (ref 36.0–46.0)
Hemoglobin: 13.5 g/dL (ref 12.0–15.0)
Immature Granulocytes: 0 %
Lymphocytes Relative: 7 %
Lymphs Abs: 0.4 10*3/uL — ABNORMAL LOW (ref 0.7–4.0)
MCH: 29.9 pg (ref 26.0–34.0)
MCHC: 33.1 g/dL (ref 30.0–36.0)
MCV: 90.3 fL (ref 80.0–100.0)
Monocytes Absolute: 1 10*3/uL (ref 0.1–1.0)
Monocytes Relative: 16 %
Neutro Abs: 4.6 10*3/uL (ref 1.7–7.7)
Neutrophils Relative %: 77 %
Platelets: 210 10*3/uL (ref 150–400)
RBC: 4.52 MIL/uL (ref 3.87–5.11)
RDW: 14.6 % (ref 11.5–15.5)
WBC: 6.1 10*3/uL (ref 4.0–10.5)
nRBC: 0 % (ref 0.0–0.2)

## 2021-04-29 LAB — RESP PANEL BY RT-PCR (FLU A&B, COVID) ARPGX2
Influenza A by PCR: NEGATIVE
Influenza B by PCR: NEGATIVE
SARS Coronavirus 2 by RT PCR: POSITIVE — AB

## 2021-04-29 MED ORDER — IBUPROFEN 400 MG PO TABS
600.0000 mg | ORAL_TABLET | Freq: Once | ORAL | Status: AC
Start: 2021-04-29 — End: 2021-04-29
  Administered 2021-04-29: 600 mg via ORAL
  Filled 2021-04-29: qty 1

## 2021-04-29 MED ORDER — NIRMATRELVIR/RITONAVIR (PAXLOVID)TABLET: 3.0000 | ORAL_TABLET | Freq: Two times a day (BID) | ORAL | 0 refills | Status: AC

## 2021-04-29 MED ORDER — NIRMATRELVIR/RITONAVIR (PAXLOVID)TABLET: 3.0000 | ORAL_TABLET | Freq: Two times a day (BID) | ORAL | 0 refills | Status: DC

## 2021-04-29 MED ORDER — ACETAMINOPHEN 325 MG PO TABS
650.0000 mg | ORAL_TABLET | Freq: Once | ORAL | Status: AC
  Administered 2021-04-29: 650 mg via ORAL
  Filled 2021-04-29: qty 2

## 2021-04-29 NOTE — ED Notes (Signed)
Patient taking morning medications.

## 2021-04-29 NOTE — ED Triage Notes (Signed)
Pt sent from [facility] for COVID test. Pt reports that since yesterday she's had chills, fatigue, headache. Pt reports taking tylenol at home yesterday to help with fever of 101.

## 2021-04-29 NOTE — Discharge Instructions (Addendum)
We signed the ER for flulike symptoms that appear to be because of COVID-19.  At your request, we have prescribed you with Paxlovid.  Please stop taking the Lipitor effective today.  The prescription has been sent to [facility] pharmacy.  The pharmacist over there we will review all your medications make sure that there is no other drug interactions that would prevent you from taking Paxlvoid.  Supportive treatment for your disease includes taking Tylenol and over-the-counter Mucinex for symptom control.  CDC guidelines recommend 5 days of quarantine followed by 5 days of constant mask use if you are in public.  Return to the ER if you start having worsening shortness of breath, weakness, fainting or near fainting spells.

## 2021-04-29 NOTE — ED Provider Notes (Signed)
[facility] EMERGENCY DEPT Provider Note   CSN: [id number] Arrival date & time: 04/29/21  1142     History No chief complaint on file.   Gwendolyn Luna is a 60 y.o. female.  HPI     60 year old female comes in with chief complaint of body aches, fevers, chills and headache.  Her symptoms started last night. + covid vaccination and boosters. No underlying lung dz and denies coug/uri.  Past Medical History:  Diagnosis Date   H/O dyspareunia 11/09/1992   H/O infertility 09/18/1991   H/O menorrhagia 06/21/2006   H/O urinary incontinence 06/21/2006   History of uterine fibroid 09/17/1988   Hypertension    Ovarian cyst, right 08/16/2006   Pelvic adhesions 01/15/1993   Perimenopausal symptoms 02/13/2001   Pyelonephritis 10/18/1992   Trichomonas 06/19/1993    Patient Active Problem List   Diagnosis Date Noted   IUD contraception 01/15/2012    Past Surgical History:  Procedure Laterality Date   ANKLE SURGERY  12/93   MYOMECTOMY ABDOMINAL APPROACH  01/30/93     OB History     Gravida  1   Para      Term      Preterm      AB  1   Living         SAB      IAB      Ectopic      Multiple      Live Births              No family history on file.  Social History   Tobacco Use   Smoking status: Never   Smokeless tobacco: Never  Vaping Use   Vaping Use: Never used  Substance Use Topics   Alcohol use: No   Drug use: No    Home Medications Prior to Admission medications   Medication Sig Start Date End Date Taking? Authorizing Provider  atorvastatin (LIPITOR) 40 MG tablet Take 40 mg by mouth daily.   Yes [provider]  losartan (COZAAR) 50 MG tablet Take 50 mg by mouth daily.   Yes [provider]  nirmatrelvir/ritonavir EUA (PAXLOVID) TABS Take 3 tablets by mouth 2 (two) times daily for 5 days. Patient GFR is >60. Take nirmatrelvir (150 mg) two tablets twice daily for 5 days and ritonavir (100 mg) one tablet  twice daily for 5 days. 04/29/21 05/04/21 Yes Derwood Kaplan, MD  ferrous sulfate 325 (65 FE) MG tablet Take 325 mg by mouth daily with breakfast.    [provider]  HYDROcodone-acetaminophen (NORCO/VICODIN) 5-325 MG per tablet Take 1-2 tablets by mouth every 6 (six) hours as needed for pain. 06/14/13   Molpus, John, MD  phentermine (ADIPEX-P) 37.5 MG tablet Take 37.5 mg by mouth daily before breakfast.    [provider]    Allergies    Penicillins  Review of Systems   Review of Systems  Constitutional:  Positive for activity change, chills, fatigue and fever.  Respiratory:  Negative for cough.   Gastrointestinal:  Negative for vomiting.  Musculoskeletal:  Positive for arthralgias and myalgias.  Allergic/Immunologic: Negative for immunocompromised state.  Neurological:  Positive for headaches.  All other systems reviewed and are negative.  Physical Exam Updated Vital Signs BP (!) 150/99   Pulse 93   Temp 99.1 F (37.3 C) (Oral)   Resp 14   Ht 5\' 4"  (1.626 m)   Wt 98.4 kg   SpO2 96%   BMI 37.25 kg/m  Physical Exam Vitals and nursing note reviewed.  Constitutional:      Appearance: She is well-developed.  HENT:     Head: Atraumatic.  Cardiovascular:     Rate and Rhythm: Normal rate.  Pulmonary:     Effort: Pulmonary effort is normal.  Musculoskeletal:     Cervical back: Normal range of motion and neck supple.  Skin:    General: Skin is warm and dry.  Neurological:     Mental Status: She is alert and oriented to person, place, and time.    ED Results / Procedures / Treatments   Labs (all labs ordered are listed, but only abnormal results are displayed) Labs Reviewed  RESP PANEL BY RT-PCR (FLU A&B, COVID) ARPGX2 - Abnormal; Notable for the following components:      Result Value   SARS Coronavirus 2 by RT PCR POSITIVE (*)    All other components within normal limits  BASIC METABOLIC PANEL - Abnormal; Notable for the following components:    Glucose, Bld 123 (*)    All other components within normal limits  CBC WITH DIFFERENTIAL/PLATELET - Abnormal; Notable for the following components:   Lymphs Abs 0.4 (*)    All other components within normal limits    EKG None  Radiology No results found.  Procedures Procedures   Medications Ordered in ED Medications  ibuprofen (ADVIL) tablet 600 mg (600 mg Oral Given 04/29/21 1252)  acetaminophen (TYLENOL) tablet 650 mg (650 mg Oral Given 04/29/21 1442)    ED Course  I have reviewed the triage vital signs and the nursing notes.  Pertinent labs & imaging results that were available during my care of the patient were reviewed by me and considered in my medical decision making (see chart for details).    MDM Rules/Calculators/A&P                           Gwendolyn Luna was evaluated in Emergency Department on 04/29/2021 for the symptoms described in the history of present illness. She was evaluated in the context of the global COVID-19 pandemic, which necessitated consideration that the patient might be at risk for infection with the SARS-CoV-2 virus that causes COVID-19. Institutional protocols and algorithms that pertain to the evaluation of patients at risk for COVID-19 are in a state of rapid change based on information released by regulatory bodies including the CDC and federal and state organizations. These policies and algorithms were followed during the patient's care in the ED.   Results of the ER work-up discussed with the patient. Given her age and BMI over 59 -we did discuss with her the treatment of Paxlovid. Patient is on statin.  She is interested in taking the treatment.  We have advised her to stop taking the statin.  Prescription has been sent to [facility] outpatient pharmacy so that they can review other contraindication.  Strict ER return precautions have been discussed, and patient is agreeing with the plan and is comfortable with the workup done and the  recommendations from the ER.    Final Clinical Impression(s) / ED Diagnoses Final diagnoses:  COVID-19    Rx / DC Orders ED Discharge Orders          Ordered    nirmatrelvir/ritonavir EUA (PAXLOVID) TABS  2 times daily        04/29/21 1459             Derwood Kaplan, MD 04/29/21 1502

## 2021-04-29 NOTE — ED Notes (Signed)
CRITICAL VALUE STICKER  CRITICAL VALUE:COVID +  RECEIVER (on-site recipient of call):Carmon Ginsberg, RN  DATE & TIME NOTIFIED: 04-29-2021 1343  MESSENGER (representative from lab):  MD NOTIFIED: Dr. Rhunette Croft  TIME OF NOTIFICATION:1344  RESPONSE:

## 2021-04-29 NOTE — ED Notes (Signed)
Patient reports fever, cough, chills and headache started yesterday.

## 2023-01-15 ENCOUNTER — Other Ambulatory Visit (HOSPITAL_BASED_OUTPATIENT_CLINIC_OR_DEPARTMENT_OTHER): Payer: Self-pay

## 2023-01-15 DIAGNOSIS — R0683 Snoring: Secondary | ICD-10-CM

## 2023-01-15 DIAGNOSIS — G4710 Hypersomnia, unspecified: Secondary | ICD-10-CM

## 2023-01-15 DIAGNOSIS — R5383 Other fatigue: Secondary | ICD-10-CM

## 2023-01-15 DIAGNOSIS — R0681 Apnea, not elsewhere classified: Secondary | ICD-10-CM

## 2023-03-28 ENCOUNTER — Other Ambulatory Visit (HOSPITAL_BASED_OUTPATIENT_CLINIC_OR_DEPARTMENT_OTHER): Payer: Self-pay

## 2023-03-28 DIAGNOSIS — G4710 Hypersomnia, unspecified: Secondary | ICD-10-CM

## 2023-03-28 DIAGNOSIS — R5383 Other fatigue: Secondary | ICD-10-CM

## 2023-03-28 DIAGNOSIS — R0683 Snoring: Secondary | ICD-10-CM

## 2023-03-28 DIAGNOSIS — R0681 Apnea, not elsewhere classified: Secondary | ICD-10-CM

## 2023-04-15 ENCOUNTER — Ambulatory Visit (HOSPITAL_BASED_OUTPATIENT_CLINIC_OR_DEPARTMENT_OTHER): Payer: 59 | Attending: Family Medicine | Admitting: Internal Medicine

## 2023-07-20 ENCOUNTER — Other Ambulatory Visit: Payer: Self-pay

## 2023-07-20 ENCOUNTER — Encounter (HOSPITAL_BASED_OUTPATIENT_CLINIC_OR_DEPARTMENT_OTHER): Payer: Self-pay | Admitting: Urology

## 2023-07-20 ENCOUNTER — Emergency Department (HOSPITAL_BASED_OUTPATIENT_CLINIC_OR_DEPARTMENT_OTHER)
Admission: EM | Admit: 2023-07-20 | Discharge: 2023-07-20 | Discharge status: Against Medical Advice | Payer: 59 | Attending: Emergency Medicine | Admitting: Emergency Medicine

## 2023-07-20 DIAGNOSIS — Z5321 Procedure and treatment not carried out due to patient leaving prior to being seen by health care provider: Secondary | ICD-10-CM | POA: Diagnosis not present

## 2023-07-20 DIAGNOSIS — I10 Essential (primary) hypertension: Secondary | ICD-10-CM | POA: Insufficient documentation

## 2023-07-20 DIAGNOSIS — Z79899 Other long term (current) drug therapy: Secondary | ICD-10-CM | POA: Insufficient documentation

## 2023-07-20 NOTE — ED Notes (Signed)
Pt states since BP is good she is going home, encouraged to stay and see provider, states will follow up with her PCP .

## 2023-07-20 NOTE — ED Triage Notes (Signed)
Pt states hypertension x 2 days  Pt states BP at home 135/97  States dull headache   Take BP med at home (Cozaar), took last night
